# Patient Record
Sex: Male | Born: 1985 | Hispanic: Yes | Marital: Married
Health system: Southern US, Community
[De-identification: ages and names within clinical notes are randomized; demographics above are authoritative.]

---

## 2020-05-19 ENCOUNTER — Encounter: Payer: Self-pay | Admitting: Emergency Medicine

## 2020-05-19 ENCOUNTER — Emergency Department
Admission: EM | Admit: 2020-05-19 | Discharge: 2020-05-19 | Disposition: A | Discharge status: Home / Self Care | Payer: Self-pay | Attending: Emergency Medicine | Admitting: Emergency Medicine

## 2020-05-19 ENCOUNTER — Emergency Department: Payer: Self-pay

## 2020-05-19 ENCOUNTER — Other Ambulatory Visit: Payer: Self-pay

## 2020-05-19 DIAGNOSIS — Y92410 Unspecified street and highway as the place of occurrence of the external cause: Secondary | ICD-10-CM | POA: Insufficient documentation

## 2020-05-19 DIAGNOSIS — R519 Headache, unspecified: Secondary | ICD-10-CM | POA: Diagnosis not present

## 2020-05-19 DIAGNOSIS — S169XXA Unspecified injury of muscle, fascia and tendon at neck level, initial encounter: Secondary | ICD-10-CM | POA: Diagnosis present

## 2020-05-19 DIAGNOSIS — S161XXA Strain of muscle, fascia and tendon at neck level, initial encounter: Secondary | ICD-10-CM | POA: Insufficient documentation
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
## 2023-02-16 ENCOUNTER — Ambulatory Visit: Payer: Self-pay

## 2023-02-16 LAB — GLUCOSE, POCT (MANUAL RESULT ENTRY): POC Glucose: 94 mg/dL (ref 70–99)

## 2023-02-16 NOTE — Congregational Nurse Program (Signed)
 Pt attended [facility] to complete enrollment into the [employer] Un[employer] Program and was referred to me to conduct health screening/interview due to patient has not seen PCP in over one(1) year.  Chief Reasons Needed for PCP (See Visit Info Section of chart)   Denies any hx of medical conditions  Vital Signs Checked  (See readings in vitals section of chart)  Socio-determinants health needs identified: - Denies any at the time of visit, but did tour [employer] in the event he becomes at need for food in future as resource -PHQ 9 = 0  (Low) - denies any behavioral health concerns at this time    Plan -  Enrollment completed by [employer] Program enrollment team Fay HERO. (Care Guide) on today 1.8.25 -Medicaid application completed (pending approval)   -Pt was educated on the use of Urgent vs Emergency room in the event their  PCP office is not available or open for business.     -Urgent vs emergency conditions were also reviewed with the patient -Reviewed and educated pt on health screening results   Scheduled Appointment for PCP visit for Thursday, February 17, 2023 @ 1:15 pm at the [facility]

## 2023-02-17 ENCOUNTER — Ambulatory Visit: Payer: Self-pay | Admitting: Physician Assistant

## 2023-02-17 ENCOUNTER — Encounter: Payer: Self-pay | Admitting: Physician Assistant

## 2023-02-17 VITALS — BP 104/66 | HR 70 | Temp 97.9°F

## 2023-02-17 DIAGNOSIS — H0012 Chalazion right lower eyelid: Secondary | ICD-10-CM

## 2023-02-17 DIAGNOSIS — Z7689 Persons encountering health services in other specified circumstances: Secondary | ICD-10-CM

## 2023-02-17 NOTE — Patient Instructions (Signed)
 Chalazion  A chalazion is a swelling or lump on the eyelid. It can affect the upper eyelid or the lower eyelid. What are the causes? This condition may be caused by: Long-lasting (chronic) inflammation of the eyelid glands. A blocked oil gland in the eyelid. What are the signs or symptoms? Symptoms of this condition include: Swelling of the eyelid that: May spread to areas around the eye. May be painful. A hard lump on the eyelid. Blurry vision. The lump may make it hard to see out of the eye. How is this diagnosed? This condition is diagnosed with an examination of the eye. How is this treated? This condition is treated by applying a warm, moist cloth (warm compress) to the eyelid. If the condition does not improve, it may be treated with: Medicine that is applied to the eye. Oral medicines. Medicine that is injected into the chalazion. Surgery. Follow these instructions at home: Managing pain and swelling Apply a warm compress to the eyelid for 10-15 minutes, 4 to 6 times a day. This will help to open any blocked glands and to reduce redness and swelling. Take and apply over-the-counter and prescription medicines only as told by your health care provider. General instructions Do not touch the chalazion. Do not try to remove the pus. Do not squeeze the chalazion or stick it with a pin or needle. Do not rub your eyes. Wash your hands often with soap and water for at least 20 seconds. Dry your hands with a clean towel. Keep your face, scalp, and eyebrows clean. Avoid wearing eye makeup. Keep all follow-up visits. This is important. Contact a health care provider if: Your eyelid is getting worse. You have a fever. The chalazion does not break open (rupture) or go away on its own and your eyelid has not improved for 4 weeks. Get help right away if: You have pain in your eye. Your vision worsens. The chalazion becomes painful or red. The chalazion gets bigger. Summary A  chalazion is a swelling or lump on the upper or lower eyelid. It may be caused by chronic inflammation or a blocked oil gland. Apply a warm compress to the eyelid for 10-15 minutes, 4 to 6 times a day. Keep your face, scalp, and eyebrows clean. This information is not intended to replace advice given to you by your health care provider. Make sure you discuss any questions you have with your health care provider. Document Revised: 04/02/2020 Document Reviewed: 04/02/2020 Elsevier Patient Education  2024 [employer].

## 2023-02-17 NOTE — Progress Notes (Signed)
 BP 104/66   Pulse 70   Temp 97.9 F (36.6 C)   SpO2 99%    Subjective:    Patient ID: William Golden, male    DOB: [birth year], 38 y.o.   MRN: [id number]  HPI: William Golden is a 38 y.o. male presenting on 02/17/2023 for New Patient (Initial Visit)   HPI   Pt is 37yoM who presents to establish care.  Pt works doing [employer], [employer].    It has been a Long time since he had a  PCP.  He got covid vax but doesn't have his card with him.  He got flu shot yesterday.  Pt reports a thing on eye since Friday.  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   He was at work.  No vision changes.  He does not wear glasses or contacts.      Relevant past medical, surgical, family and social history reviewed and updated as indicated. Interim medical history since our last visit reviewed. Allergies and medications reviewed and updated.  No current outpatient medications on file.   Review of Systems  Per HPI unless specifically indicated above     Objective:    BP 104/66   Pulse 70   Temp 97.9 F (36.6 C)   SpO2 99%   Wt Readings from Last 3 Encounters:  02/16/23 171 lb (77.6 kg)  05/19/20 270 lb (122.5 kg)    Physical Exam Vitals reviewed.  Constitutional:      General: He is not in acute distress.    Appearance: He is well-developed. He is not toxic-appearing.  HENT:     Head: Normocephalic and atraumatic.     Right Ear: Tympanic membrane, ear canal and external ear normal.     Left Ear: Ear canal and external ear normal. There is impacted cerumen.  Eyes:     Extraocular Movements: Extraocular movements intact.     Conjunctiva/sclera: Conjunctivae normal.     Pupils: Pupils are equal, round, and reactive to light.     Comments: Lump/swelling right lower eyelid as pictured.    Neck:     Thyroid: No thyromegaly.  Cardiovascular:     Rate and Rhythm: Normal rate and regular rhythm.  Pulmonary:     Effort: Pulmonary effort is normal.     Breath sounds: Normal breath  sounds. No wheezing or rales.  Abdominal:     General: Bowel sounds are normal.     Palpations: Abdomen is soft. There is no mass.     Tenderness: There is no abdominal tenderness.  Musculoskeletal:     Cervical back: Neck supple.     Right lower leg: No edema.     Left lower leg: No edema.  Lymphadenopathy:     Cervical: No cervical adenopathy.  Skin:    General: Skin is warm and dry.     Findings: No rash.  Neurological:     Mental Status: He is alert and oriented to person, place, and time.     Motor: No weakness or tremor.     Gait: Gait is intact.     Deep Tendon Reflexes:     Reflex Scores:      Patellar reflexes are 2+ on the right side and 2+ on the left side. Psychiatric:        Attention and Perception: Attention normal.        Behavior: Behavior normal. Behavior is cooperative.  Assessment & Plan:    Encounter Diagnoses  Name Primary?   Encounter to establish care Yes   Chalazion of right lower eyelid       -pt counseled to use warm compresses often and use care to avoid burn -pt is counseled to avoid picking or mashing at the bump  -pt to RTO for recheck 2 weeks.  He is to contact office sooner for worsening or new symtpoms

## 2023-03-02 ENCOUNTER — Ambulatory Visit: Payer: Self-pay | Admitting: Physician Assistant
